# Patient Record
Sex: Male | Born: 1960 | Race: White | Hispanic: No | Marital: Married | Smoking: Never smoker
Health system: Southern US, Community
[De-identification: ages and names within clinical notes are randomized; demographics above are authoritative.]

## PROBLEM LIST (undated history)

## (undated) DIAGNOSIS — E7800 Pure hypercholesterolemia, unspecified: Secondary | ICD-10-CM

## (undated) DIAGNOSIS — K219 Gastro-esophageal reflux disease without esophagitis: Secondary | ICD-10-CM

## (undated) DIAGNOSIS — I10 Essential (primary) hypertension: Secondary | ICD-10-CM

## (undated) DIAGNOSIS — M542 Cervicalgia: Secondary | ICD-10-CM

## (undated) DIAGNOSIS — M722 Plantar fascial fibromatosis: Secondary | ICD-10-CM

## (undated) DIAGNOSIS — N200 Calculus of kidney: Secondary | ICD-10-CM

## (undated) DIAGNOSIS — E785 Hyperlipidemia, unspecified: Secondary | ICD-10-CM

## (undated) HISTORY — DX: Essential (primary) hypertension: I10

## (undated) HISTORY — DX: Pure hypercholesterolemia, unspecified: E78.00

## (undated) HISTORY — PX: HERNIA REPAIR: SHX51

## (undated) HISTORY — DX: Gastro-esophageal reflux disease without esophagitis: K21.9

---

## 2013-05-01 ENCOUNTER — Ambulatory Visit: Payer: Self-pay | Admitting: Gastroenterology

## 2013-05-04 LAB — PATHOLOGY REPORT

## 2013-10-25 ENCOUNTER — Emergency Department: Payer: Self-pay | Admitting: Emergency Medicine

## 2013-10-25 LAB — COMPREHENSIVE METABOLIC PANEL
Alkaline Phosphatase: 104 U/L (ref 50–136)
BUN: 20 mg/dL — ABNORMAL HIGH (ref 7–18)
Bilirubin,Total: 0.5 mg/dL (ref 0.2–1.0)
Calcium, Total: 8.9 mg/dL (ref 8.5–10.1)
Co2: 30 mmol/L (ref 21–32)
EGFR (African American): >60
EGFR (Non-African Amer.): >60
Glucose: 119 mg/dL — ABNORMAL HIGH (ref 65–99)
Potassium: 4 mmol/L (ref 3.5–5.1)
SGOT(AST): 35 U/L (ref 15–37)
Total Protein: 7.6 g/dL (ref 6.4–8.2)

## 2013-10-25 LAB — URINALYSIS, COMPLETE
Glucose,UR: NEGATIVE mg/dL (ref 0–75)
Ketone: NEGATIVE
Nitrite: NEGATIVE
Ph: 6 (ref 4.5–8.0)
Protein: NEGATIVE
RBC,UR: 26 /HPF (ref 0–5)
Specific Gravity: 1.017 (ref 1.003–1.030)

## 2013-10-25 LAB — CBC
HCT: 47.2 % (ref 40.0–52.0)
MCHC: 36.2 g/dL — ABNORMAL HIGH (ref 32.0–36.0)
MCV: 95 fL (ref 80–100)
RDW: 12.9 % (ref 11.5–14.5)
WBC: 10.9 10*3/uL — ABNORMAL HIGH (ref 3.8–10.6)

## 2013-10-25 LAB — LIPASE, BLOOD: Lipase: 125 U/L (ref 73–393)

## 2013-10-29 ENCOUNTER — Ambulatory Visit: Payer: Self-pay | Admitting: Urology

## 2013-11-24 ENCOUNTER — Ambulatory Visit: Payer: Self-pay | Admitting: Urology

## 2014-07-15 ENCOUNTER — Ambulatory Visit: Payer: Self-pay | Admitting: Urology

## 2014-12-18 DIAGNOSIS — I10 Essential (primary) hypertension: Secondary | ICD-10-CM | POA: Insufficient documentation

## 2014-12-18 DIAGNOSIS — E7800 Pure hypercholesterolemia, unspecified: Secondary | ICD-10-CM

## 2014-12-18 DIAGNOSIS — K219 Gastro-esophageal reflux disease without esophagitis: Secondary | ICD-10-CM | POA: Insufficient documentation

## 2014-12-18 HISTORY — DX: Pure hypercholesterolemia, unspecified: E78.00

## 2014-12-18 HISTORY — DX: Essential (primary) hypertension: I10

## 2014-12-18 HISTORY — DX: Gastro-esophageal reflux disease without esophagitis: K21.9

## 2014-12-26 IMAGING — CT CT ABD-PELV W/ CM
1 of 3 series · 13 of 32 positions shown, 19 images · non-contrast
Comparison: none

REASON FOR EXAM: (1) R flank pain; (2) R flank pain
COMMENTS:   May transport without cardiac monitor

PROCEDURE:     CT  - CT ABDOMEN / PELVIS  W  - October 26, 2013 [DATE]
RESULT:     History: Right flank pain.
Comparison Study: No prior.

[Series 2: soft tissue · axial · 0.87mm/px · z∈[-514,-67]mm · 13 of 173 slices shown, 19 images]
[im 12/173  soft-tissue]
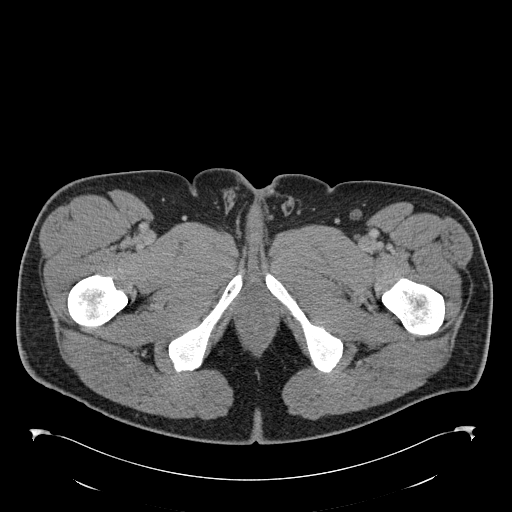
[im 12/173  bone]
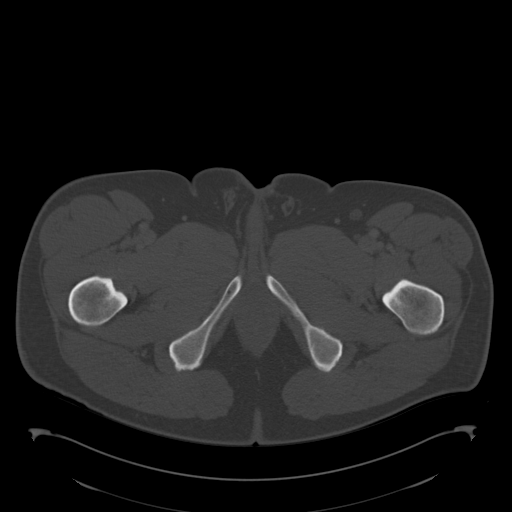
[im 23/173  soft-tissue]
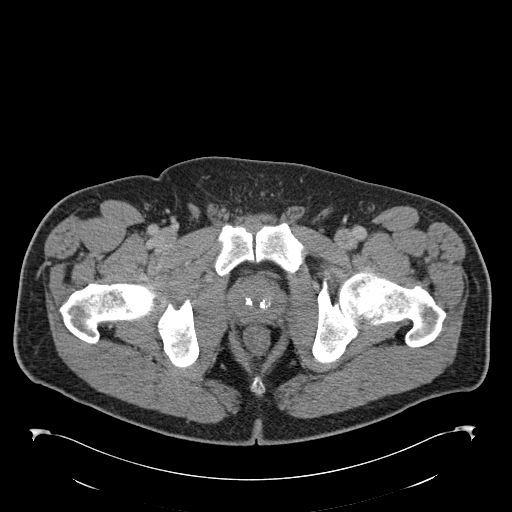
[im 35/173  soft-tissue]
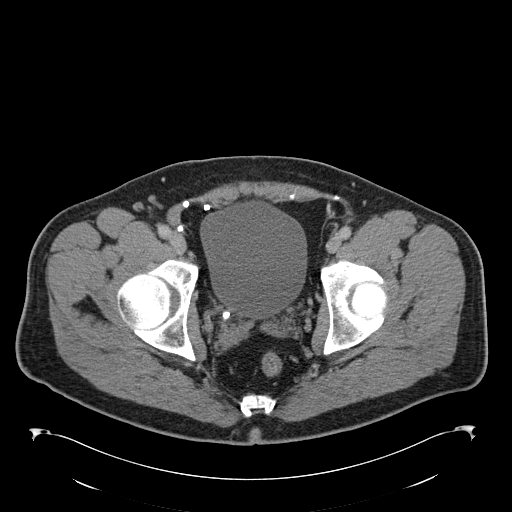
[im 46/173  soft-tissue]
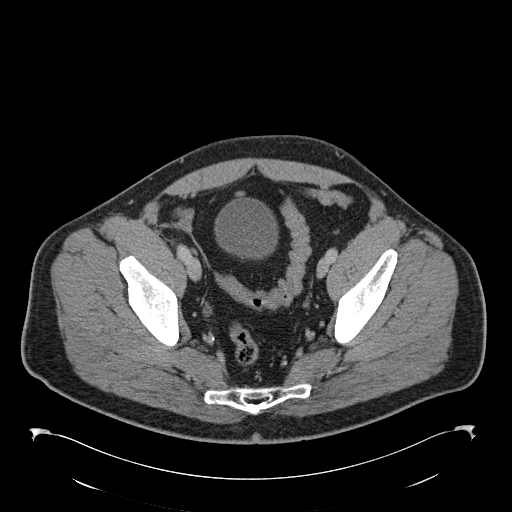
[im 58/173  soft-tissue]
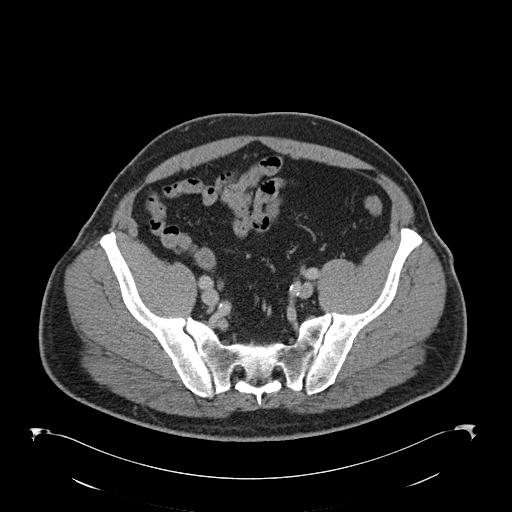
[im 69/173  soft-tissue]
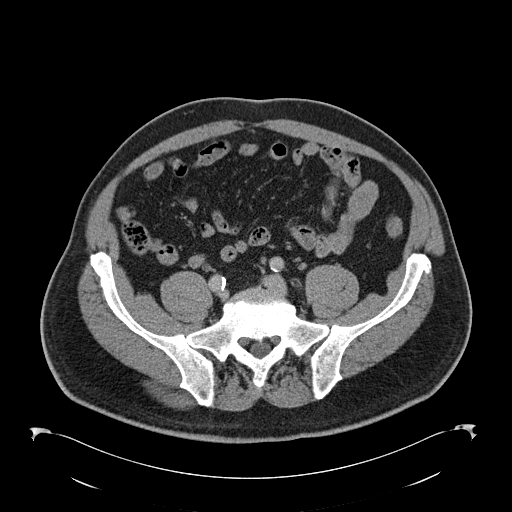
[im 92/173  soft-tissue]
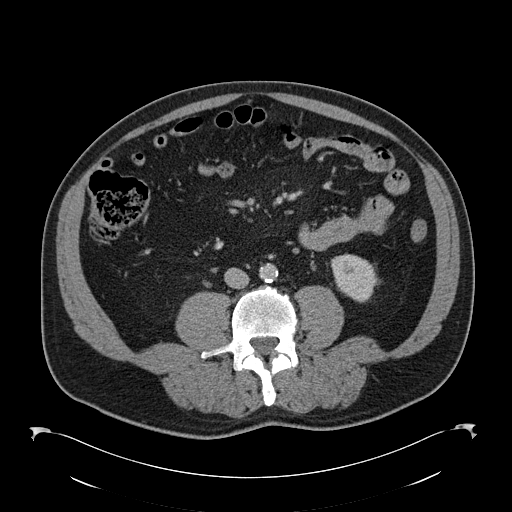
[im 104/173  soft-tissue]
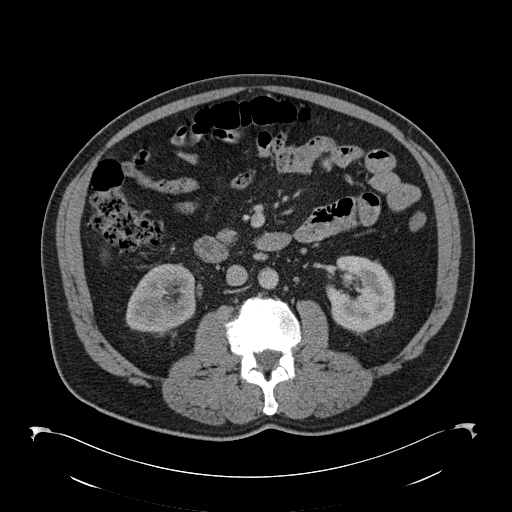
[im 115/173  soft-tissue]
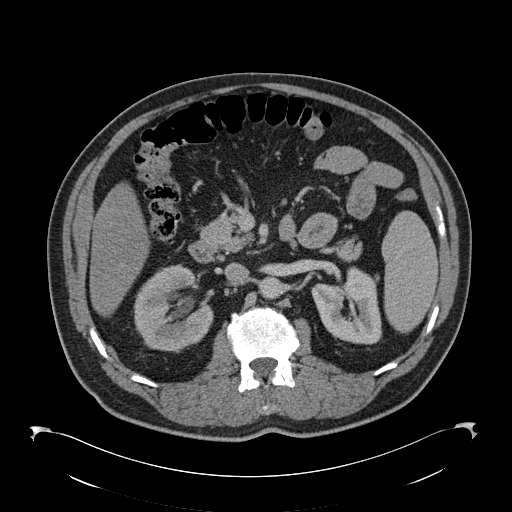
[im 115/173  bone]
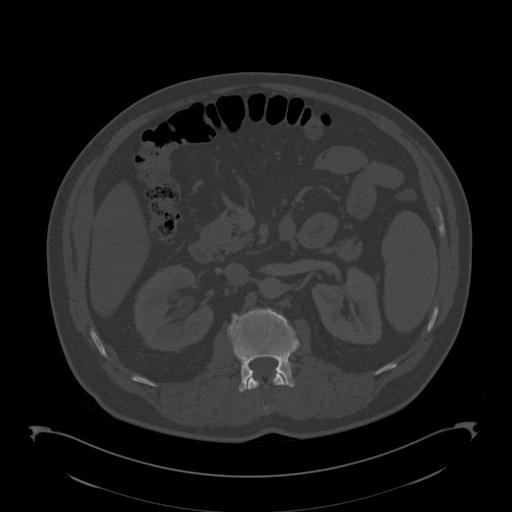
[im 127/173  soft-tissue]
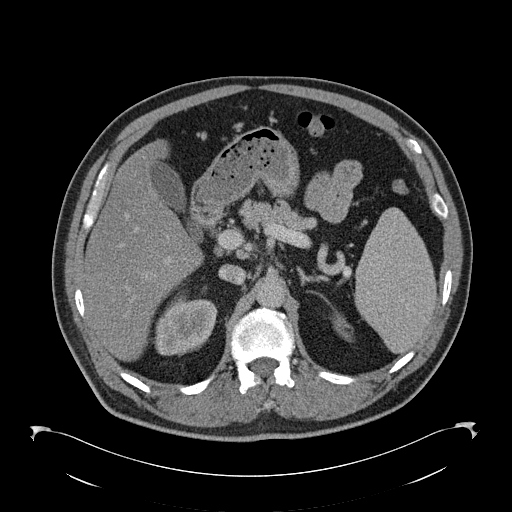
[im 127/173  lung]
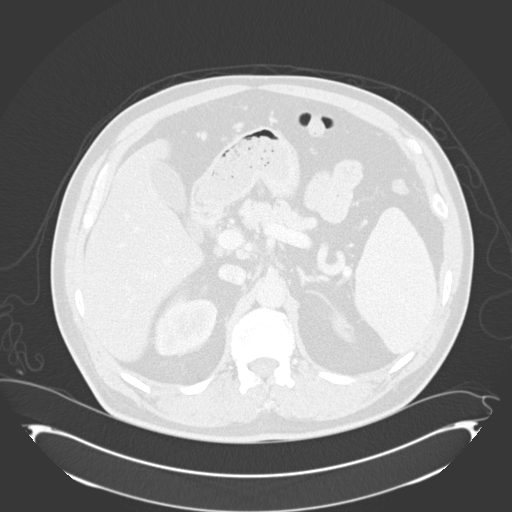
[im 138/173  soft-tissue]
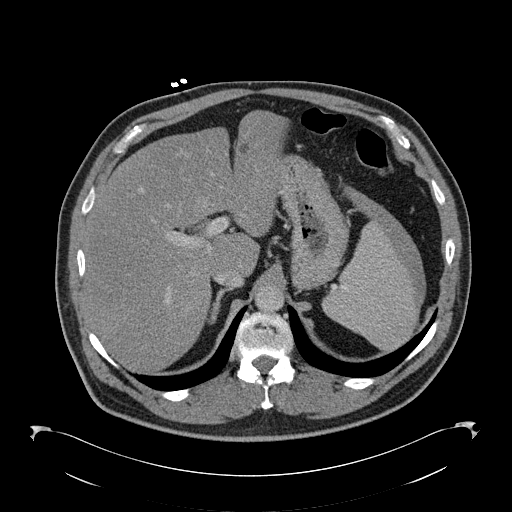
[im 138/173  lung]
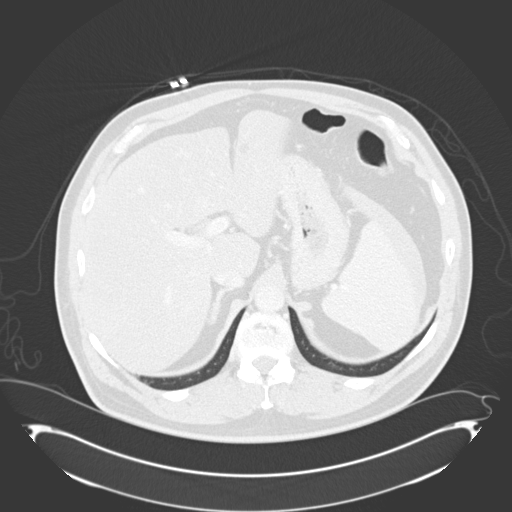
[im 150/173  soft-tissue]
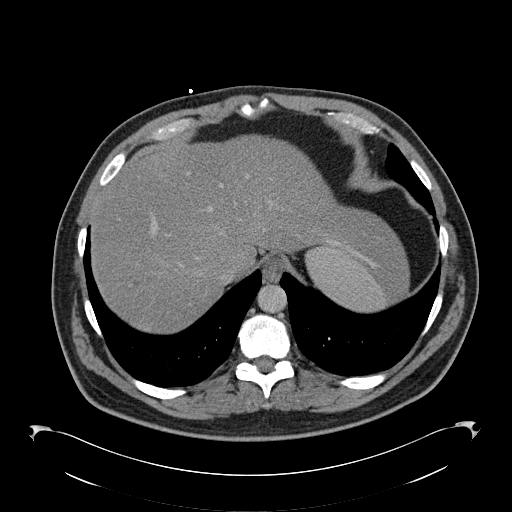
[im 150/173  lung]
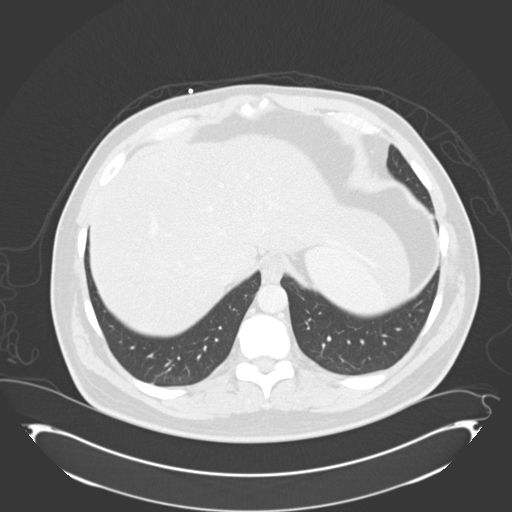
[im 161/173  soft-tissue]
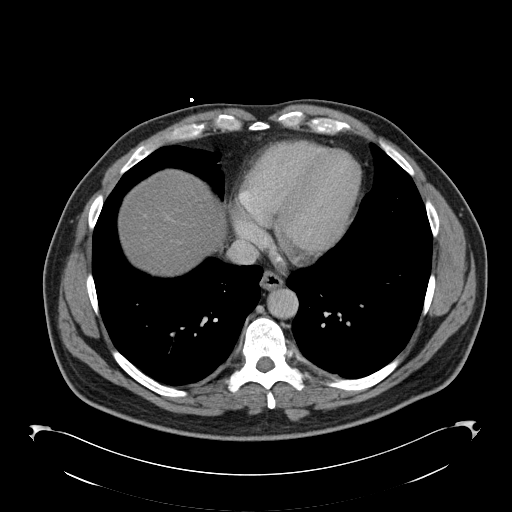
[im 161/173  lung]
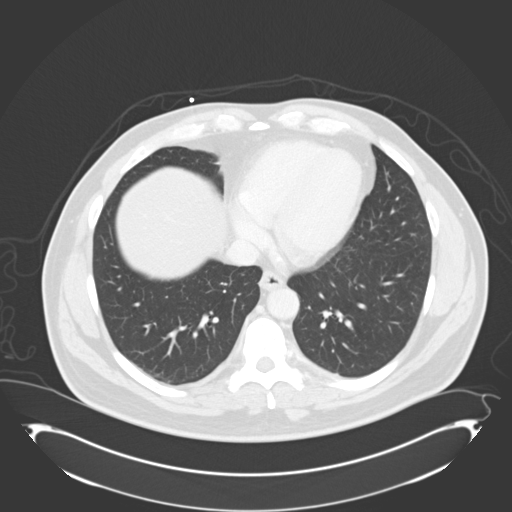

[13 of 32 positions shown; findings below may reference images not displayed]

FINDINGS: Standard nonenhanced scan obtained. No prior studies available for
comparison. Diffuse fatty infiltration liver is present. Tiny enhancing
nodular density in the right hepatic lobe is noted most likely tiny
hemangioma. Simple cyst left hepatic lobe. Spleen normal. Gallbladder
nondistended. No biliary distention. Pancreas normal. Adrenals normal. No
focal hepatic renal abnormality identified. 8mm stone in the distal right
ureter with mild hydronephrosis. Bladder is unremarkable. Calcification in
the prostate. No free pelvic fluid.

No adenopathy. Aorta and its branches are patent.

Lung bases clear. Heart size normal. Degenerative changes lumbar spine.
Postsurgical changes noted consistent prior hernia repair lower abdomen.
IMPRESSION: 8mm stone distal right ureter with mild hydronephrosis.

## 2015-11-04 ENCOUNTER — Encounter: Payer: Self-pay | Admitting: *Deleted

## 2015-11-07 ENCOUNTER — Ambulatory Visit: Payer: BLUE CROSS/BLUE SHIELD | Admitting: Anesthesiology

## 2015-11-07 ENCOUNTER — Ambulatory Visit
Admission: RE | Admit: 2015-11-07 | Discharge: 2015-11-07 | Disposition: A | Discharge status: Home / Self Care | Payer: BLUE CROSS/BLUE SHIELD | Source: Ambulatory Visit | Attending: Gastroenterology | Admitting: Gastroenterology

## 2015-11-07 ENCOUNTER — Encounter
Admission: RE | Disposition: A | Discharge status: Home / Self Care | Payer: Self-pay | Source: Ambulatory Visit | Attending: Gastroenterology

## 2015-11-07 ENCOUNTER — Encounter: Payer: Self-pay | Admitting: Anesthesiology

## 2015-11-07 DIAGNOSIS — R1084 Generalized abdominal pain: Secondary | ICD-10-CM | POA: Insufficient documentation

## 2015-11-07 DIAGNOSIS — Z888 Allergy status to other drugs, medicaments and biological substances status: Secondary | ICD-10-CM | POA: Diagnosis not present

## 2015-11-07 DIAGNOSIS — K210 Gastro-esophageal reflux disease with esophagitis: Secondary | ICD-10-CM | POA: Insufficient documentation

## 2015-11-07 DIAGNOSIS — E785 Hyperlipidemia, unspecified: Secondary | ICD-10-CM | POA: Diagnosis not present

## 2015-11-07 DIAGNOSIS — Z791 Long term (current) use of non-steroidal anti-inflammatories (NSAID): Secondary | ICD-10-CM | POA: Insufficient documentation

## 2015-11-07 DIAGNOSIS — R1013 Epigastric pain: Secondary | ICD-10-CM | POA: Insufficient documentation

## 2015-11-07 DIAGNOSIS — Z79899 Other long term (current) drug therapy: Secondary | ICD-10-CM | POA: Insufficient documentation

## 2015-11-07 DIAGNOSIS — I10 Essential (primary) hypertension: Secondary | ICD-10-CM | POA: Insufficient documentation

## 2015-11-07 DIAGNOSIS — R197 Diarrhea, unspecified: Secondary | ICD-10-CM | POA: Diagnosis not present

## 2015-11-07 HISTORY — DX: Plantar fascial fibromatosis: M72.2

## 2015-11-07 HISTORY — DX: Essential (primary) hypertension: I10

## 2015-11-07 HISTORY — PX: ESOPHAGOGASTRODUODENOSCOPY (EGD) WITH PROPOFOL: SHX5813

## 2015-11-07 HISTORY — DX: Gastro-esophageal reflux disease without esophagitis: K21.9

## 2015-11-07 HISTORY — DX: Calculus of kidney: N20.0

## 2015-11-07 HISTORY — DX: Cervicalgia: M54.2

## 2015-11-07 HISTORY — DX: Hyperlipidemia, unspecified: E78.5

## 2015-11-07 SURGERY — ESOPHAGOGASTRODUODENOSCOPY (EGD) WITH PROPOFOL
Anesthesia: General

## 2015-11-07 MED ORDER — SODIUM CHLORIDE 0.9 % IV SOLN: INTRAVENOUS | Status: DC

## 2015-11-07 MED ORDER — SODIUM CHLORIDE 0.9 % IV SOLN
INTRAVENOUS | Status: DC
  Administered 2015-11-07: 1000 mL via INTRAVENOUS
  Administered 2015-11-07: 14:00:00 via INTRAVENOUS

## 2015-11-07 MED ORDER — MIDAZOLAM HCL 2 MG/2ML IJ SOLN
INTRAMUSCULAR | Status: DC | PRN
  Administered 2015-11-07: 2 mg via INTRAVENOUS

## 2015-11-07 NOTE — Op Note (Signed)
[facility] Patient Name: Dale Sanchez Procedure Date: 11/07/2015 1:53 PM MRN: [id number] Account #: [id number] Date of Birth: 1961/03/03 Admit Type: Outpatient Age: 54 Room: [facility] ENDO ROOM 4 Gender: Male Note Status: Finalized Procedure:         Upper GI endoscopy Indications:       Epigastric abdominal pain, Suspected esophageal reflux Providers:         Ezzard Standing. Bluford Kaufmann, MD Medicines:         Monitored Anesthesia Care Complications:     No immediate complications. Procedure:         Pre-Anesthesia Assessment:                    - Prior to the procedure, a History and Physical was                     performed, and patient medications, allergies and                     sensitivities were reviewed. The patient's tolerance of                     previous anesthesia was reviewed.                    - The risks and benefits of the procedure and the sedation                     options and risks were discussed with the patient. All                     questions were answered and informed consent was obtained.                    - After reviewing the risks and benefits, the patient was                     deemed in satisfactory condition to undergo the procedure.                    After obtaining informed consent, the endoscope was passed                     under direct vision. Throughout the procedure, the                     patient's blood pressure, pulse, and oxygen saturations                     were monitored continuously. The Olympus GIF-160 endoscope                     (S#. [id number]) was introduced through the mouth, and                     advanced to the second part of duodenum. The upper GI                     endoscopy was accomplished without difficulty. The patient                     tolerated the procedure well. Findings:      LA Grade B (one or more mucosal breaks greater than 5 mm, not extending  between the tops of two mucosal  folds) esophagitis was found at the       gastroesophageal junction. Biopsies were taken with a cold forceps for       histology.      The exam was otherwise without abnormality.      The entire examined stomach was normal.      The examined duodenum was normal. Impression:        - LA Grade B reflux esophagitis. Biopsied.                    - The examination was otherwise normal.                    - Normal stomach.                    - Normal examined duodenum. Recommendation:    - Discharge patient to home.                    - Observe patient's clinical course.                    - Continue present medications.                    - The findings and recommendations were discussed with the                     patient. Procedure Code(s): --- Professional ---                    [id number], Esophagogastroduodenoscopy, flexible, transoral;                     with biopsy, single or multiple Diagnosis Code(s): --- Professional ---                    K21.0, Gastro-esophageal reflux disease with esophagitis                    R10.13, Epigastric pain CPT copyright 2014 American Medical Association. All rights reserved. The codes documented in this report are preliminary and upon coder review may  be revised to meet current compliance requirements. [name], MD 11/07/2015 2:11:10 PM This report has been signed electronically. Number of Addenda: 0 Note Initiated On: 11/07/2015 1:53 PM      [facility]

## 2015-11-07 NOTE — Anesthesia Preprocedure Evaluation (Signed)
Anesthesia Evaluation  Patient identified by MRN, date of birth, ID band Patient awake    Reviewed: Allergy & Precautions, H&P , NPO status , Patient's Chart, lab work & pertinent test results, reviewed documented beta blocker date and time   History of Anesthesia Complications Negative for: history of anesthetic complications  Airway Mallampati: III  TM Distance: >3 FB Neck ROM: full    Dental no notable dental hx. (+) Caps   Pulmonary neg pulmonary ROS,    Pulmonary exam normal breath sounds clear to auscultation       Cardiovascular Exercise Tolerance: Good hypertension, On Medications (-) angina(-) CAD, (-) Past MI, (-) Cardiac Stents and (-) CABG Normal cardiovascular exam(-) dysrhythmias (-) Valvular Problems/Murmurs Rhythm:regular Rate:Normal     Neuro/Psych negative neurological ROS  negative psych ROS   GI/Hepatic Neg liver ROS, GERD  Medicated and Poorly Controlled,  Endo/Other  negative endocrine ROS  Renal/GU Renal disease (kidney stone)  negative genitourinary   Musculoskeletal   Abdominal   Peds  Hematology negative hematology ROS (+)   Anesthesia Other Findings Past Medical History:   Hypertension                                                 GERD (gastroesophageal reflux disease)                       Cervicalgia                                                  Hyperlipidemia                                               Nephrolithiasis                                              Plantar fibromatosis                                         Reproductive/Obstetrics negative OB ROS                             Anesthesia Physical Anesthesia Plan  ASA: II  Anesthesia Plan: General   Post-op Pain Management:    Induction:   Airway Management Planned:   Additional Equipment:   Intra-op Plan:   Post-operative Plan:   Informed Consent: I have reviewed the  patients History and Physical, chart, labs and discussed the procedure including the risks, benefits and alternatives for the proposed anesthesia with the patient or authorized representative who has indicated his/her understanding and acceptance.   Dental Advisory Given  Plan Discussed with: Anesthesiologist, CRNA and Surgeon  Anesthesia Plan Comments:         Anesthesia Quick Evaluation

## 2015-11-07 NOTE — Transfer of Care (Signed)
Immediate Anesthesia Transfer of Care Note  Patient: Dale Sanchez  Procedure(s) Performed: Procedure(s): ESOPHAGOGASTRODUODENOSCOPY (EGD) WITH PROPOFOL (N/A)  Patient Location: PACU  Anesthesia Type:General  Level of Consciousness: sedated  Airway & Oxygen Therapy: Patient Spontanous Breathing and Patient connected to nasal cannula oxygen  Post-op Assessment: Report given to RN and Post -op Vital signs reviewed and stable  Post vital signs: Reviewed and stable  Last Vitals:  Filed Vitals:   11/07/15 1309  BP: 152/96  Pulse: 87  Temp: 37.5 C  Resp: 16    Complications: No apparent anesthesia complications

## 2015-11-08 ENCOUNTER — Encounter: Payer: Self-pay | Admitting: Gastroenterology

## 2015-11-08 NOTE — Anesthesia Postprocedure Evaluation (Signed)
  Anesthesia Post-op Note  Patient: Dale Sanchez  Procedure(s) Performed: Procedure(s): ESOPHAGOGASTRODUODENOSCOPY (EGD) WITH PROPOFOL (N/A)  Anesthesia type:General  Patient location: PACU  Post pain: Pain level controlled  Post assessment: Post-op Vital signs reviewed, Patient's Cardiovascular Status Stable, Respiratory Function Stable, Patent Airway and No signs of Nausea or vomiting  Post vital signs: Reviewed and stable  Last Vitals:  Filed Vitals:   11/07/15 1450  BP: 138/94  Pulse: 79  Temp:   Resp: 20    Level of consciousness: awake, alert  and patient cooperative  Complications: No apparent anesthesia complications

## 2015-11-10 ENCOUNTER — Encounter: Payer: Self-pay | Admitting: *Deleted

## 2015-11-10 ENCOUNTER — Other Ambulatory Visit: Payer: Self-pay | Admitting: *Deleted

## 2015-11-10 LAB — SURGICAL PATHOLOGY

## 2015-11-11 ENCOUNTER — Ambulatory Visit (INDEPENDENT_AMBULATORY_CARE_PROVIDER_SITE_OTHER): Payer: BLUE CROSS/BLUE SHIELD | Admitting: Obstetrics and Gynecology

## 2015-11-11 ENCOUNTER — Encounter: Payer: Self-pay | Admitting: Obstetrics and Gynecology

## 2015-11-11 VITALS — BP 141/90 | HR 74 | Temp 98.1°F | Resp 16 | Ht 75.0 in | Wt 245.8 lb

## 2015-11-11 DIAGNOSIS — R109 Unspecified abdominal pain: Secondary | ICD-10-CM | POA: Diagnosis not present

## 2015-11-11 LAB — URINALYSIS, COMPLETE
Bilirubin, UA: NEGATIVE
Glucose, UA: NEGATIVE
Ketones, UA: NEGATIVE
Nitrite, UA: NEGATIVE
PH UA: 5 (ref 5.0–7.5)
PROTEIN UA: NEGATIVE
RBC, UA: NEGATIVE
Specific Gravity, UA: 1.02 (ref 1.005–1.030)
Urobilinogen, Ur: 0.2 mg/dL (ref 0.2–1.0)

## 2015-11-11 LAB — MICROSCOPIC EXAMINATION
BACTERIA UA: NONE SEEN
Epithelial Cells (non renal): NONE SEEN /hpf (ref 0–10)
RBC, UA: NONE SEEN /hpf (ref 0–?)

## 2015-11-11 MED ORDER — TAMSULOSIN HCL 0.4 MG PO CAPS: 0.4000 mg | ORAL_CAPSULE | Freq: Every day | ORAL | Status: DC

## 2015-11-11 NOTE — Progress Notes (Signed)
11/11/2015 10:41 AM   Dale Sanchez May 30, 1961 [id number]  Referring provider: Lauro Regulus, MD [address] [facility] [place], [place] [address]  Chief Complaint  Patient presents with  . Flank Pain    Left side.  ~2 months.  . Establish Care    HPI: Patient is a 54yo male with a history of renal stones presenting today with complaints of left flank pain x 2 months.   Does report urinary frequency.  No dysuria or gross hematuria.  No fevers, nausea or vomiting.  First and only stone episode succesful treated with ESWL 07/15/14.  Prostate cancer screening performed by PCP per patient.  PMH: Past Medical History  Diagnosis Date  . Hypertension   . GERD (gastroesophageal reflux disease)   . Cervicalgia   . Hyperlipidemia   . Nephrolithiasis   . Plantar fibromatosis   . Essential (primary) hypertension 12/18/2014    Last Assessment & Plan:  Blood pressure has been controlled without significant dizzyness or associated fatigue. Taking antihypertensives as directed without difficulty.    . Acid reflux 12/18/2014    Overview:  Off omeprazole.   Last Assessment & Plan:  Heartburn and reflux symptoms are essentially controlled.    . Pure hypercholesterolemia 12/18/2014    Last Assessment & Plan:  Has been working on a low fat diet and no myalgia's are present.      Surgical History: Past Surgical History  Procedure Laterality Date  . Hernia repair    . Esophagogastroduodenoscopy (egd) with propofol N/A 11/07/2015    Procedure: ESOPHAGOGASTRODUODENOSCOPY (EGD) WITH PROPOFOL;  Surgeon: Wallace Cullens, MD;  [facility] END[facility]COPY;  Service: Gastroenterology;  Laterality: N/A;    Home Medications:    Medication List       This list is accurate as of: 11/11/15 10:41 AM.  Always use your most recent med list.               amLODipine 5 MG tablet  Commonly known as:  NORVASC  Take 5 mg by mouth daily.     meloxicam 15 MG tablet  Commonly known  as:  MOBIC  Take 15 mg by mouth daily.     metoprolol 200 MG 24 hr tablet  Commonly known as:  TOPROL-XL  Take 200 mg by mouth daily.     metroNIDAZOLE 500 MG tablet  Commonly known as:  FLAGYL  Take 500 mg by mouth 2 (two) times daily.     omeprazole 20 MG capsule  Commonly known as:  PRILOSEC  Take 20 mg by mouth daily as needed.     pantoprazole 40 MG tablet  Commonly known as:  PROTONIX  Take 40 mg by mouth 2 (two) times daily.     PRAVACHOL 80 MG tablet  Generic drug:  pravastatin  Take 80 mg by mouth daily.     tamsulosin 0.4 MG Caps capsule  Commonly known as:  FLOMAX  Take 1 capsule (0.4 mg total) by mouth daily.     triamcinolone cream 0.1 %  Commonly known as:  KENALOG  APPLY TO AFFECTED AREA(S) 2 - 3 TIMES DAILY FOR 2-3 WEEKS        Allergies:  Allergies  Allergen Reactions  . Cozaar [Losartan]   . Lisinopril     Family History: Family History  Problem Relation Age of Onset  . Heart attack Father   . Hypertension Mother     Social History:  reports that he has  never smoked. He does not have any smokeless tobacco history on file. He reports that he does not drink alcohol. His drug history is not on file.  ROS: UROLOGY Frequent Urination?: Yes Hard to postpone urination?: Yes Burning/pain with urination?: No Get up at night to urinate?: Yes Leakage of urine?: No Urine stream starts and stops?: No Trouble starting stream?: No Do you have to strain to urinate?: No Blood in urine?: No Urinary tract infection?: No Sexually transmitted disease?: No Injury to kidneys or bladder?: No Painful intercourse?: No Weak stream?: No Erection problems?: No Penile pain?: No  Gastrointestinal Nausea?: No Vomiting?: No Indigestion/heartburn?: No Diarrhea?: No Constipation?: No  Constitutional Fever: No Night sweats?: No Weight loss?: No Fatigue?: No  Skin Skin rash/lesions?: No Itching?: No  Eyes Blurred vision?: No Double vision?:  No  Ears/Nose/Throat Sore throat?: No Sinus problems?: No  Hematologic/Lymphatic Swollen glands?: No Easy bruising?: No  Cardiovascular Leg swelling?: No Chest pain?: No  Respiratory Cough?: No Shortness of breath?: No  Endocrine Excessive thirst?: No  Musculoskeletal Back pain?: Yes Joint pain?: Yes  Neurological Headaches?: No Dizziness?: No  Psychologic Depression?: No Anxiety?: No  Physical Exam: BP 141/90 mmHg  Pulse 74  Temp(Src) 98.1 F (36.7 C)  Resp 16  Ht 6\' 3"  (1.905 m)  Wt 245 lb 12.8 oz (111.494 kg)  BMI 30.72 kg/m2  Constitutional:  Alert and oriented, No acute distress. HEENT: [place] AT, moist mucus membranes.  Trachea midline, no masses. Cardiovascular: No clubbing, cyanosis, or edema. Respiratory: Normal respiratory effort, no increased work of breathing. GI: Abdomen is soft, nontender, nondistended, no abdominal masses GU: mild left CVA tenderness.  Skin: No rashes, bruises or suspicious lesions. Lymph: No cervical or inguinal adenopathy. Neurologic: Grossly intact, no focal deficits, moving all 4 extremities. Psychiatric: Normal mood and affect.  Laboratory Data:   Urinalysis    Component Value Date/Time   COLORURINE Yellow 10/25/2013 2104   APPEARANCEUR Clear 10/25/2013 2104   LABSPEC 1.017 10/25/2013 2104   PHURINE 6.0 10/25/2013 2104   GLUCOSEU Negative 10/25/2013 2104   HGBUR 2+ 10/25/2013 2104   BILIRUBINUR Negative 10/25/2013 2104   KETONESUR Negative 10/25/2013 2104   PROTEINUR Negative 10/25/2013 2104   NITRITE Negative 10/25/2013 2104   LEUKOCYTESUR Negative 10/25/2013 2104    Pertinent Imaging:   Assessment & Plan:   1. Flank pain- Patient will complaints of persistent left flank pain for the last 2 months. He denies gross hematuria or dysuria. He is experiencing some increased urinary frequency. He does have a history of renal stones. Last and only episode was approximately 2 years ago and was successfully treated  with ESWL. He reports present symptoms are very similar to his previous stone symptoms. We will obtain a CT renal stone study and have patient follow up for results. He understands to seek immediate medical attention for fevers uncontrolled pain or nausea and vomiting. -Start Flomax -CT Renal Stone Study -BMP - Urinalysis, Complete -Urine strainer provided   Return for CT results .  These notes generated with voice recognition software. I apologize for typographical errors.  Earlie LouLindsay Ezri Fanguy, FNP  [facility] [address], [address] [place], [place] [address] [phone]

## 2015-11-21 ENCOUNTER — Ambulatory Visit
Admission: RE | Admit: 2015-11-21 | Discharge: 2015-11-21 | Disposition: A | Discharge status: Home / Self Care | Payer: BLUE CROSS/BLUE SHIELD | Source: Ambulatory Visit | Attending: Obstetrics and Gynecology | Admitting: Obstetrics and Gynecology

## 2015-11-21 DIAGNOSIS — R109 Unspecified abdominal pain: Secondary | ICD-10-CM

## 2015-11-21 DIAGNOSIS — K760 Fatty (change of) liver, not elsewhere classified: Secondary | ICD-10-CM | POA: Insufficient documentation

## 2015-11-21 DIAGNOSIS — N200 Calculus of kidney: Secondary | ICD-10-CM | POA: Insufficient documentation

## 2015-11-22 ENCOUNTER — Encounter: Payer: Self-pay | Admitting: Obstetrics and Gynecology

## 2015-11-22 ENCOUNTER — Ambulatory Visit (INDEPENDENT_AMBULATORY_CARE_PROVIDER_SITE_OTHER): Payer: BLUE CROSS/BLUE SHIELD | Admitting: Obstetrics and Gynecology

## 2015-11-22 VITALS — BP 133/87 | HR 76 | Resp 16 | Ht 75.0 in | Wt 244.9 lb

## 2015-11-22 DIAGNOSIS — N200 Calculus of kidney: Secondary | ICD-10-CM | POA: Diagnosis not present

## 2015-11-22 DIAGNOSIS — R109 Unspecified abdominal pain: Secondary | ICD-10-CM

## 2015-11-22 LAB — BLADDER SCAN AMB NON-IMAGING: Scan Result: 124

## 2015-11-22 NOTE — Progress Notes (Signed)
11/22/2015 11:53 AM   Dale Sanchez [birth year] [id number]  Referring provider: Lauro RegulusMarshall W Anderson, MD [address] [facility] [place], [place] [id number]  Chief Complaint  Patient presents with  . Flank Pain  . Results    CT    HPI: Patient is a 54yo male with a history of renal stones presenting today to review CT results performed for evaluation of left flank pain x 2 months.   Does report urinary frequency. No dysuria or gross hematuria. No fevers, nausea or vomiting.  First and only stone episode succesful treated with ESWL 07/15/14.  Prostate cancer screening performed by PCP per patient.  CT  IMPRESSION: 1. No acute findings. No findings to explain left flank pain. 2. Small nonobstructing stones in each kidney. No ureteral stone or obstructive uropathy. 3. Extensive hepatic steatosis. 4. Significant degenerative changes noted throughout the visualized spine.   PMH: Past Medical History  Diagnosis Date  . Hypertension   . GERD (gastroesophageal reflux disease)   . Cervicalgia   . Hyperlipidemia   . Nephrolithiasis   . Plantar fibromatosis   . Essential (primary) hypertension 12/18/2014    Last Assessment & Plan:  Blood pressure has been controlled without significant dizzyness or associated fatigue. Taking antihypertensives as directed without difficulty.    . Acid reflux 12/18/2014    Overview:  Off omeprazole.   Last Assessment & Plan:  Heartburn and reflux symptoms are essentially controlled.    . Pure hypercholesterolemia 12/18/2014    Last Assessment & Plan:  Has been working on a low fat diet and no myalgia's are present.      Surgical History: Past Surgical History  Procedure Laterality Date  . Hernia repair    . Esophagogastroduodenoscopy (egd) with propofol N/A 11/07/2015    Procedure: ESOPHAGOGASTRODUODENOSCOPY (EGD) WITH PROPOFOL;  Surgeon: Wallace CullensPaul Y Oh, MD;  Location[facility] ENDOSCOPY;  Service: Gastroenterology;  Laterality:  N/A;    Home Medications:    Medication List       This list is accurate as of: 11/22/15 11:53 AM.  Always use your most recent med list.               amLODipine 5 MG tablet  Commonly known as:  NORVASC  Take 5 mg by mouth daily.     meloxicam 15 MG tablet  Commonly known as:  MOBIC  Take 15 mg by mouth daily.     metoprolol 200 MG 24 hr tablet  Commonly known as:  TOPROL-XL  Take 200 mg by mouth daily.     omeprazole 20 MG capsule  Commonly known as:  PRILOSEC  Take 20 mg by mouth daily as needed.     pantoprazole 40 MG tablet  Commonly known as:  PROTONIX  Take 40 mg by mouth 2 (two) times daily.     PRAVACHOL 80 MG tablet  Generic drug:  pravastatin  Take 80 mg by mouth daily.     tamsulosin 0.4 MG Caps capsule  Commonly known as:  FLOMAX  Take 1 capsule (0.4 mg total) by mouth daily.     triamcinolone cream 0.1 %  Commonly known as:  KENALOG  APPLY TO AFFECTED AREA(S) 2 - 3 TIMES DAILY FOR 2-3 WEEKS        Allergies:  Allergies  Allergen Reactions  . Cozaar [Losartan]   . Lisinopril     Family History: Family History  Problem Relation Age of Onset  . Heart attack Father   .  Hypertension Mother     Social History:  reports that he has never smoked. He does not have any smokeless tobacco history on file. He reports that he does not drink alcohol. His drug history is not on file.  ROS: UROLOGY Frequent Urination?: Yes Hard to postpone urination?: Yes Burning/pain with urination?: No Get up at night to urinate?: Yes Leakage of urine?: No Urine stream starts and stops?: No Trouble starting stream?: No Do you have to strain to urinate?: No Blood in urine?: No Urinary tract infection?: No Sexually transmitted disease?: No Injury to kidneys or bladder?: No Painful intercourse?: No Weak stream?: No Erection problems?: No Penile pain?: No  Gastrointestinal Nausea?: No Vomiting?: No Indigestion/heartburn?: No Diarrhea?:  No Constipation?: No  Constitutional Fever: No Night sweats?: No Weight loss?: No Fatigue?: No  Skin Skin rash/lesions?: No Itching?: No  Eyes Blurred vision?: No Double vision?: No  Ears/Nose/Throat Sore throat?: No Sinus problems?: No  Hematologic/Lymphatic Swollen glands?: No Easy bruising?: No  Cardiovascular Leg swelling?: No Chest pain?: No  Respiratory Cough?: No Shortness of breath?: No  Endocrine Excessive thirst?: No  Musculoskeletal Back pain?: No Joint pain?: No  Neurological Headaches?: No Dizziness?: No  Psychologic Depression?: No Anxiety?: No  Physical Exam: BP 133/87 mmHg  Pulse 76  Resp 16  Ht  (1.905 m)  Wt 244 lb 14.4 oz (111.086 kg)  BMI 30.61 kg/m2  Constitutional:  Alert and oriented, No acute distress. HEENT: [place] AT, moist mucus membranes.  Trachea midline, no masses. Cardiovascular: No clubbing, cyanosis, or edema. Respiratory: Normal respiratory effort, no increased work of breathing. Skin: No rashes, bruises or suspicious lesions. Neurologic: Grossly intact, no focal deficits, moving all 4 extremities. Psychiatric: Normal mood and affect.  Laboratory Data:   Urinalysis    Component Value Date/Time   COLORURINE Yellow 10/25/2013 2104   APPEARANCEUR Clear 10/25/2013 2104   LABSPEC 1.017 10/25/2013 2104   PHURINE 6.0 10/25/2013 2104   GLUCOSEU Negative 11/11/2015 0912   GLUCOSEU Negative 10/25/2013 2104   HGBUR 2+ 10/25/2013 2104   BILIRUBINUR Negative 11/11/2015 0912   BILIRUBINUR Negative 10/25/2013 2104   KETONESUR Negative 10/25/2013 2104   PROTEINUR Negative 10/25/2013 2104   NITRITE Negative 11/11/2015 0912   NITRITE Negative 10/25/2013 2104   LEUKOCYTESUR Trace* 11/11/2015 0912   LEUKOCYTESUR Negative 10/25/2013 2104    Pertinent Imaging: CLINICAL DATA: Left flank pain for 2 months. History of a hernia repair.  EXAM: CT ABDOMEN AND PELVIS WITHOUT CONTRAST  TECHNIQUE: Multidetector CT  imaging of the abdomen and pelvis was performed following the standard protocol without IV contrast.  COMPARISON: CT, 10/26/2013.  FINDINGS: Lung bases: Clear. Heart normal size.  Liver: Diffuse fatty infiltration. No mass or focal lesion.  Spleen, gallbladder, pancreas, adrenal glands: Normal.  Kidneys, ureters, bladder: Small bilateral nonobstructing intrarenal stones. No renal masses. No hydronephrosis. Normal ureters. Bladder is unremarkable.  Lymph nodes: No adenopathy.  Ascites: None.  Gastrointestinal: Stomach, small bowel and colon are unremarkable. No appendix visualized.  Abdominal wall: Surgical mesh noted along the inguinal regions bilaterally consistent with previous bilateral inguinal herniorrhaphies, stable from the prior CT. No recurrent or new hernia.  Musculoskeletal: Degenerative changes noted throughout the visualized spine. No osteoblastic or osteolytic lesions.  IMPRESSION: 1. No acute findings. No findings to explain left flank pain. 2. Small nonobstructing stones in each kidney. No ureteral stone or obstructive uropathy. 3. Extensive hepatic steatosis. 4. Significant degenerative changes noted throughout the visualized spine.   Electronically Signed  By:  Amie Portland M.D.  On: 11/21/2015 15:02          Assessment & Plan:    1. Flank pain- Unclear etiology.  There is no clear urologic cause of his pain.  Possibly related to DDD. - Basic metabolic panel to assess renal function.  2. Urinary Frequency- Slightly elevated PVR . I recommended double voiding in the mornings and prior to bed. Patient states he only took previously prescribed Flomax for a few days but is unsure if there was an improvement in urinary symptoms.  I suggested patient try Flomax again and monitor symptoms.  He has his annual physical scheduled with his PCP in January and will f/u with him for refills if desired. We discussed general  stone prevention techniques including drinking plenty water with goal of producing 2.5 L urine daily, increased citric acid intake, avoidance of high oxalate containing foods, and decreased salt intake.  Information about dietary recommendations given today.   3. Kidney stones- Small bilateral renal calculi. No obstruction. F/u 1 year with [facility] prior.  No Follow-up on file.  These notes generated with voice recognition software. I apologize for typographical errors.  Earlie Lou, FNP  [facility] [address], [address] [place], [place] [address] [phone]

## 2015-11-23 LAB — BASIC METABOLIC PANEL
BUN / CREAT RATIO: 12 (ref 9–20)
BUN: 13 mg/dL (ref 6–24)
CHLORIDE: 103 mmol/L (ref 97–106)
CO2: 24 mmol/L (ref 18–29)
Calcium: 9.6 mg/dL (ref 8.7–10.2)
Creatinine, Ser: 1.06 mg/dL (ref 0.76–1.27)
GFR calc non Af Amer: 80 mL/min/{1.73_m2} (ref >59)
GFR, EST AFRICAN AMERICAN: 92 mL/min/{1.73_m2} (ref >59)
Glucose: 91 mg/dL (ref 65–99)
Potassium: 4.2 mmol/L (ref 3.5–5.2)
SODIUM: 142 mmol/L (ref 136–144)

## 2015-11-28 ENCOUNTER — Telehealth: Payer: Self-pay

## 2015-11-28 NOTE — Telephone Encounter (Signed)
-----   Message from Fernanda DrumLindsay C Overton, FNP sent at 11/23/2015  9:19 AM EST ----- Please notify patient that his kidney function levels were good. thanks

## 2015-11-28 NOTE — Telephone Encounter (Signed)
LMOM- labs good  

## 2015-12-07 ENCOUNTER — Other Ambulatory Visit: Payer: Self-pay | Admitting: Obstetrics and Gynecology

## 2015-12-07 DIAGNOSIS — N200 Calculus of kidney: Secondary | ICD-10-CM

## 2016-07-17 ENCOUNTER — Other Ambulatory Visit
Admission: RE | Admit: 2016-07-17 | Discharge: 2016-07-17 | Disposition: A | Discharge status: Home / Self Care | Payer: BLUE CROSS/BLUE SHIELD | Source: Ambulatory Visit | Attending: Internal Medicine | Admitting: Internal Medicine

## 2016-07-17 DIAGNOSIS — R079 Chest pain, unspecified: Secondary | ICD-10-CM | POA: Diagnosis not present

## 2016-07-17 LAB — FIBRIN DERIVATIVES D-DIMER (ARMC ONLY): Fibrin derivatives D-dimer (ARMC): 310 (ref 0–499)

## 2016-07-17 LAB — TROPONIN I

## 2016-11-21 ENCOUNTER — Ambulatory Visit: Payer: BLUE CROSS/BLUE SHIELD | Admitting: Urology

## 2016-12-10 ENCOUNTER — Ambulatory Visit (INDEPENDENT_AMBULATORY_CARE_PROVIDER_SITE_OTHER): Payer: BLUE CROSS/BLUE SHIELD | Admitting: Urology

## 2016-12-10 ENCOUNTER — Other Ambulatory Visit: Payer: Self-pay | Admitting: *Deleted

## 2016-12-10 ENCOUNTER — Encounter: Payer: Self-pay | Admitting: Urology

## 2016-12-10 ENCOUNTER — Ambulatory Visit
Admission: RE | Admit: 2016-12-10 | Discharge: 2016-12-10 | Disposition: A | Discharge status: Home / Self Care | Payer: BLUE CROSS/BLUE SHIELD | Source: Ambulatory Visit | Attending: Urology | Admitting: Urology

## 2016-12-10 VITALS — BP 175/81 | HR 71 | Ht 75.0 in | Wt 244.3 lb

## 2016-12-10 DIAGNOSIS — N200 Calculus of kidney: Secondary | ICD-10-CM

## 2016-12-10 NOTE — Progress Notes (Signed)
12/10/2016 4:33 PM   Dale Sanchez 1961/11/10 [id number]  Referring provider: Lauro RegulusMarshall W Anderson, MD [address] [facility] [place], [place] [address]  Chief Complaint  Patient presents with  . Follow-up    1 year follow up kidney stone    HPI: Patient is a 55 yo Caucasian male with a history of renal stones presenting today for a yearly follow up.    Patient has not had any flank pain, passage of fragments or gross hematuria.  He is not had any urinary symptoms. The urinary frequency that he was experiencing last year has abated.  First and only stone episode successful treated with ESWL 07/15/14.  Prostate cancer screening performed by PCP per patient.  KUB taken today did not note any evidence of renal calculi or ureteral calculi. I have independently reviewed the films.    PMH: Past Medical History:  Diagnosis Date  . Acid reflux 12/18/2014   Overview:  Off omeprazole.   Last Assessment & Plan:  Heartburn and reflux symptoms are essentially controlled.    . Cervicalgia   . Essential (primary) hypertension 12/18/2014   Last Assessment & Plan:  Blood pressure has been controlled without significant dizzyness or associated fatigue. Taking antihypertensives as directed without difficulty.    Marland Kitchen. GERD (gastroesophageal reflux disease)   . Hyperlipidemia   . Hypertension   . Nephrolithiasis   . Plantar fibromatosis   . Pure hypercholesterolemia 12/18/2014   Last Assessment & Plan:  Has been working on a low fat diet and no myalgia's are present.      Surgical History: Past Surgical History:  Procedure Laterality Date  . ESOPHAGOGASTRODUODENOSCOPY (EGD) WITH PROPOFOL N/A 11/07/2015   Procedure: ESOPHAGOGASTRODUODENOSCOPY (EGD) WITH PROPOFOL;  Surgeon: Wallace CullensPaul Y Oh, MD;  [facility];  Service: Gastroenterology;  Laterality: N/A;  . HERNIA REPAIR      Home Medications:    Medication List       Accurate as of 12/10/16  4:33 PM. Always  use your most recent med list.          amLODipine 5 MG tablet Commonly known as:  NORVASC Take 5 mg by mouth daily.   atorvastatin 80 MG tablet Commonly known as:  LIPITOR Take by mouth.   azelastine 0.1 % nasal spray Commonly known as:  ASTELIN Place into the nose.   meloxicam 15 MG tablet Commonly known as:  MOBIC Take 15 mg by mouth daily.   metoprolol 200 MG 24 hr tablet Commonly known as:  TOPROL-XL Take 200 mg by mouth daily.   omeprazole 20 MG capsule Commonly known as:  PRILOSEC Take 20 mg by mouth daily as needed.   pantoprazole 40 MG tablet Commonly known as:  PROTONIX Take 40 mg by mouth 2 (two) times daily.   PRAVACHOL 80 MG tablet Generic drug:  pravastatin Take 80 mg by mouth daily.   tamsulosin 0.4 MG Caps capsule Commonly known as:  FLOMAX TAKE 1 CAPSULE (0.4 MG TOTAL) BY MOUTH DAILY.   triamcinolone cream 0.1 % Commonly known as:  KENALOG APPLY TO AFFECTED AREA(S) 2 - 3 TIMES DAILY FOR 2-3 WEEKS       Allergies:  Allergies  Allergen Reactions  . Cozaar [Losartan]   . Lisinopril     Family History: Family History  Problem Relation Age of Onset  . Heart attack Father   . Hypertension Mother   . Prostate cancer Neg Hx   . Kidney disease Neg Hx   .  Bladder Cancer Neg Hx     Social History:  reports that he has never smoked. He has never used smokeless tobacco. He reports that he does not drink alcohol or use drugs.  ROS: UROLOGY Frequent Urination?: No Hard to postpone urination?: No Burning/pain with urination?: No Get up at night to urinate?: No Leakage of urine?: No Urine stream starts and stops?: No Trouble starting stream?: No Do you have to strain to urinate?: No Blood in urine?: No Urinary tract infection?: No Sexually transmitted disease?: No Injury to kidneys or bladder?: No Painful intercourse?: No Weak stream?: No Erection problems?: No Penile pain?: No  Gastrointestinal Nausea?: No Vomiting?:  No Indigestion/heartburn?: No Diarrhea?: No Constipation?: No  Constitutional Fever: No Night sweats?: No Weight loss?: No Fatigue?: No  Skin Skin rash/lesions?: No Itching?: No  Eyes Blurred vision?: No Double vision?: No  Ears/Nose/Throat Sore throat?: No Sinus problems?: No  Hematologic/Lymphatic Swollen glands?: No Easy bruising?: No  Cardiovascular Leg swelling?: No Chest pain?: No  Respiratory Cough?: No Shortness of breath?: No  Endocrine Excessive thirst?: No  Musculoskeletal Back pain?: Yes Joint pain?: Yes  Neurological Headaches?: No Dizziness?: No  Psychologic Depression?: No Anxiety?: No  Physical Exam: BP (!) 175/81   Pulse 71   Ht 6\' 3"  (1.905 m)   Wt 244 lb 4.8 oz (110.8 kg)   BMI 30.54 kg/m   Constitutional:  Alert and oriented, No acute distress. HEENT: [place] AT, moist mucus membranes.  Trachea midline, no masses. Cardiovascular: No clubbing, cyanosis, or edema. Respiratory: Normal respiratory effort, no increased work of breathing. Skin: No rashes, bruises or suspicious lesions. Neurologic: Grossly intact, no focal deficits, moving all 4 extremities. Psychiatric: Normal mood and affect.    Pertinent Imaging: CLINICAL DATA:  Nephrolithiasis.  EXAM: ABDOMEN - 1 VIEW  COMPARISON:  Radiographs of July 15, 2014.  FINDINGS: The bowel gas pattern is normal. No radio-opaque calculi or other significant radiographic abnormality are seen.  IMPRESSION: No evidence of bowel obstruction or ileus. No nephrolithiasis is noted.   Electronically Signed   By: Lupita RaiderJames  Green Jr, M.D.   On: 12/10/2016 16:38  Assessment & Plan:     1. Kidney stones- Small bilateral renal calculi seen on CT.  None seen on today's KUB.   No obstruction. F/u 1 year with KUB prior.  - Advised to contact our office or seek treatment in the ED if becomes febrile or pain/ vomiting are difficult control in order to arrange for emergent/urgent  intervention  Return in about 1 year (around 12/10/2017) for KUB .  These notes generated with voice recognition software. I apologize for typographical errors.  Dale CowboySHANNON Adaleigh Warf, PA-C  [facility] [address], [address] [place], [place] [address] [phone]

## 2017-08-05 ENCOUNTER — Other Ambulatory Visit: Payer: Self-pay | Admitting: Student

## 2017-08-05 DIAGNOSIS — M5412 Radiculopathy, cervical region: Secondary | ICD-10-CM

## 2017-09-16 ENCOUNTER — Ambulatory Visit
Admission: RE | Admit: 2017-09-16 | Discharge: 2017-09-16 | Disposition: A | Discharge status: Home / Self Care | Payer: BLUE CROSS/BLUE SHIELD | Source: Ambulatory Visit | Attending: Student | Admitting: Student

## 2017-09-16 DIAGNOSIS — M5023 Other cervical disc displacement, cervicothoracic region: Secondary | ICD-10-CM | POA: Insufficient documentation

## 2017-09-16 DIAGNOSIS — M5412 Radiculopathy, cervical region: Secondary | ICD-10-CM | POA: Insufficient documentation

## 2017-09-16 DIAGNOSIS — M4802 Spinal stenosis, cervical region: Secondary | ICD-10-CM | POA: Insufficient documentation

## 2017-12-10 ENCOUNTER — Ambulatory Visit: Payer: BLUE CROSS/BLUE SHIELD | Admitting: Urology

## 2020-10-25 ENCOUNTER — Other Ambulatory Visit: Payer: Self-pay | Admitting: Orthopedic Surgery

## 2020-10-25 DIAGNOSIS — M5412 Radiculopathy, cervical region: Secondary | ICD-10-CM

## 2020-11-03 ENCOUNTER — Other Ambulatory Visit: Payer: Self-pay

## 2020-11-03 ENCOUNTER — Ambulatory Visit
Admission: RE | Admit: 2020-11-03 | Discharge: 2020-11-03 | Disposition: A | Discharge status: Home / Self Care | Payer: BC Managed Care – PPO | Source: Ambulatory Visit | Attending: Orthopedic Surgery | Admitting: Orthopedic Surgery

## 2020-11-03 DIAGNOSIS — M5412 Radiculopathy, cervical region: Secondary | ICD-10-CM | POA: Insufficient documentation

## 2021-03-21 ENCOUNTER — Emergency Department
Admission: EM | Admit: 2021-03-21 | Discharge: 2021-03-21 | Disposition: A | Discharge status: Home / Self Care | Payer: BC Managed Care – PPO | Attending: Emergency Medicine | Admitting: Emergency Medicine

## 2021-03-21 ENCOUNTER — Emergency Department: Payer: BC Managed Care – PPO

## 2021-03-21 ENCOUNTER — Other Ambulatory Visit: Payer: Self-pay

## 2021-03-21 DIAGNOSIS — Z79899 Other long term (current) drug therapy: Secondary | ICD-10-CM | POA: Diagnosis not present

## 2021-03-21 DIAGNOSIS — R079 Chest pain, unspecified: Secondary | ICD-10-CM | POA: Insufficient documentation

## 2021-03-21 DIAGNOSIS — I10 Essential (primary) hypertension: Secondary | ICD-10-CM | POA: Diagnosis not present

## 2021-03-21 DIAGNOSIS — K219 Gastro-esophageal reflux disease without esophagitis: Secondary | ICD-10-CM | POA: Insufficient documentation

## 2021-03-21 LAB — BASIC METABOLIC PANEL
Anion gap: 8 (ref 5–15)
BUN: 25 mg/dL — ABNORMAL HIGH (ref 6–20)
CO2: 26 mmol/L (ref 22–32)
Calcium: 9.4 mg/dL (ref 8.9–10.3)
Chloride: 106 mmol/L (ref 98–111)
Creatinine, Ser: 1.14 mg/dL (ref 0.61–1.24)
GFR, Estimated: >60 mL/min (ref >60)
Glucose, Bld: 119 mg/dL — ABNORMAL HIGH (ref 70–99)
Potassium: 3.8 mmol/L (ref 3.5–5.1)
Sodium: 140 mmol/L (ref 135–145)

## 2021-03-21 LAB — CBC
HCT: 47.5 % (ref 39.0–52.0)
Hemoglobin: 16.9 g/dL (ref 13.0–17.0)
MCH: 33.3 pg (ref 26.0–34.0)
MCHC: 35.6 g/dL (ref 30.0–36.0)
MCV: 93.5 fL (ref 80.0–100.0)
Platelets: 143 10*3/uL — ABNORMAL LOW (ref 150–400)
RBC: 5.08 MIL/uL (ref 4.22–5.81)
RDW: 12.4 % (ref 11.5–15.5)
WBC: 6.9 10*3/uL (ref 4.0–10.5)
nRBC: 0 % (ref 0.0–0.2)

## 2021-03-21 LAB — D-DIMER, QUANTITATIVE: D-Dimer, Quant: 2.9 ug/mL-FEU — ABNORMAL HIGH (ref 0.00–0.50)

## 2021-03-21 LAB — TROPONIN I (HIGH SENSITIVITY): Troponin I (High Sensitivity): 8 ng/L (ref <18)

## 2021-03-21 MED ORDER — IOHEXOL 350 MG/ML SOLN
100.0000 mL | Freq: Once | INTRAVENOUS | Status: AC | PRN
  Administered 2021-03-21: 100 mL via INTRAVENOUS

## 2021-03-21 NOTE — ED Provider Notes (Signed)
[facility] Emergency Department Provider Note  Time seen: 10:40 AM  I have reviewed the triage vital signs and the nursing notes.   HISTORY  Chief Complaint Chest Pain   HPI Dale Sanchez is a 60 y.o. male with a past medical history of gastric reflux, hypertension, hyperlipidemia, presents to the emergency department for chest pain.  According to the patient since Saturday he has been experiencing left-sided chest pain, worse with deep inspiration.  Denies any nausea shortness of breath/cough or diaphoresis.  No leg pain or swelling.  No hormonal supplements no history of DVTs.  No history of MI or cardiac disease.  Describes his chest pain as minimal but moderate sharp pain if he takes a deep breath.   Past Medical History:  Diagnosis Date  . Acid reflux 12/18/2014   Overview:  Off omeprazole.   Last Assessment & Plan:  Heartburn and reflux symptoms are essentially controlled.    . Cervicalgia   . Essential (primary) hypertension 12/18/2014   Last Assessment & Plan:  Blood pressure has been controlled without significant dizzyness or associated fatigue. Taking antihypertensives as directed without difficulty.    Marland Kitchen GERD (gastroesophageal reflux disease)   . Hyperlipidemia   . Hypertension   . Nephrolithiasis   . Plantar fibromatosis   . Pure hypercholesterolemia 12/18/2014   Last Assessment & Plan:  Has been working on a low fat diet and no myalgia's are present.      Patient Active Problem List   Diagnosis Date Noted  . Essential (primary) hypertension 12/18/2014  . Acid reflux 12/18/2014  . Pure hypercholesterolemia 12/18/2014    Past Surgical History:  Procedure Laterality Date  . ESOPHAGOGASTRODUODENOSCOPY (EGD) WITH PROPOFOL N/A 11/07/2015   Procedure: ESOPHAGOGASTRODUODENOSCOPY (EGD) WITH PROPOFOL;  Surgeon: Wallace Cullens, MD;  Location: [facility] ENDOSCOPY;  Service: Gastroenterology;  Laterality: N/A;  . HERNIA REPAIR      Prior to Admission  medications   Medication Sig Start Date End Date Taking? Authorizing Provider  amLODipine (NORVASC) 5 MG tablet Take 5 mg by mouth daily.    [provider]  atorvastatin (LIPITOR) 80 MG tablet Take by mouth. 01/04/16 01/03/17  [provider]  azelastine (ASTELIN) 0.1 % nasal spray Place into the nose. 01/04/16 01/03/17  [provider]  meloxicam (MOBIC) 15 MG tablet Take 15 mg by mouth daily.    [provider]  metoprolol (TOPROL-XL) 200 MG 24 hr tablet Take 200 mg by mouth daily.    [provider]  omeprazole (PRILOSEC) 20 MG capsule Take 20 mg by mouth daily as needed.    [provider]  pantoprazole (PROTONIX) 40 MG tablet Take 40 mg by mouth 2 (two) times daily.     [provider]  pravastatin (PRAVACHOL) 80 MG tablet Take 80 mg by mouth daily.    [provider]  tamsulosin (FLOMAX) 0.4 MG CAPS capsule TAKE 1 CAPSULE (0.4 MG TOTAL) BY MOUTH DAILY. Patient not taking: Reported on 12/10/2016 12/07/15   Fernanda Drum, FNP  triamcinolone cream (KENALOG) 0.1 % APPLY TO AFFECTED AREA(S) 2 - 3 TIMES DAILY FOR 2-3 WEEKS 08/03/15   [provider]    Allergies  Allergen Reactions  . Cozaar [Losartan]   . Lisinopril     Family History  Problem Relation Age of Onset  . Heart attack Father   . Hypertension Mother   . Prostate cancer Neg Hx   . Kidney disease Neg Hx   .  Bladder Cancer Neg Hx     Social History Social History   Tobacco Use  . Smoking status: Never Smoker  . Smokeless tobacco: Never Used  Substance Use Topics  . Alcohol use: No    Alcohol/week: 0.0 standard drinks  . Drug use: No    Review of Systems Constitutional: Negative for fever. Cardiovascular: Left chest pain worse with deep inspiration Respiratory: Negative for shortness of breath. Gastrointestinal: Negative for abdominal pain, vomiting  Musculoskeletal: Negative for musculoskeletal complaints Neurological: Negative for  headache All other ROS negative  ____________________________________________   PHYSICAL EXAM:  VITAL SIGNS: ED Triage Vitals  Enc Vitals Group     BP 03/21/21 0914 (!) 149/105     Pulse Rate 03/21/21 0914 88     Resp 03/21/21 0914 18     Temp 03/21/21 0914 98.2 F (36.8 C)     Temp Source 03/21/21 0914 Oral     SpO2 03/21/21 0914 97 %     Weight 03/21/21 0915 240 lb (108.9 kg)     Height 03/21/21 0915 6\' 3"  (1.905 m)     Head Circumference --      Peak Flow --      Pain Score 03/21/21 0915 3     Pain Loc --      Pain Edu? --      Excl. in [place]? --    Constitutional: Alert and oriented. Well appearing and in no distress. Eyes: Normal exam ENT      Head: Normocephalic and atraumatic.      Mouth/Throat: Mucous membranes are moist. Cardiovascular: Normal rate, regular rhythm.  Respiratory: Normal respiratory effort without tachypnea nor retractions. Breath sounds are clear  Gastrointestinal: Soft and nontender. No distention.   Musculoskeletal: Nontender with normal range of motion in all extremities.  Neurologic:  Normal speech and language. No gross focal neurologic deficits  Skin:  Skin is warm, dry and intact.  Psychiatric: Mood and affect are normal.   ____________________________________________    EKG  EKG viewed and interpreted by myself shows a normal sinus rhythm 88 bpm with a narrow QRS, normal axis, normal intervals, no concerning ST changes.  ____________________________________________    RADIOLOGY  Chest x-ray is negative CTA negative for large pulmonary embolus ____________________________________________   INITIAL IMPRESSION / ASSESSMENT AND PLAN / ED COURSE  Pertinent labs & imaging results that were available during my care of the patient were reviewed by me and considered in my medical decision making (see chart for details).   Patient presents to the emergency department for left-sided chest pain, worse with deep inspiration.  Pain is been  ongoing for the past 3 days.  States minimal discomfort when at rest but moderate sharp pain with deep inspiration.  Nontender chest on examination.  Reassuring vitals besides mild hypertension.  Patient is currently taking hypertensive medications.  Patient's labs are resulted showing a negative troponin however D-dimer is elevated at 2.9.  Given the patient's left-sided pleuritic chest pain with elevated D-dimer we will proceed with a CTA of the chest to rule out pulmonary embolism.  Discussed this with the patient who is agreeable to plan of care.  Due to the timing of the contrast CTA shows no central or hilar pulmonary artery embolus but unable to rule out segmental or distal embolus.  Patient's heart rate is normal vitals are largely normal.  Given the CTA being largely negative I believe a reasonable compromise instead of repeating a CT scan would be an ultrasound of  the lower extremities.  If the ultrasound is negative for DVT I believe the patient could be safely discharged home with PCP follow-up.  Patient agreeable to plan of care.  Patient care signed out to oncoming provider.  WILIAN KWONG Sherron Sanchez was evaluated in Emergency Department on 03/21/2021 for the symptoms described in the history of present illness. He was evaluated in the context of the global COVID-19 pandemic, which necessitated consideration that the patient might be at risk for infection with the SARS-CoV-2 virus that causes COVID-19. Institutional protocols and algorithms that pertain to the evaluation of patients at risk for COVID-19 are in a state of rapid change based on information released by regulatory bodies including the CDC and federal and state organizations. These policies and algorithms were followed during the patient's care in the ED.  ____________________________________________   FINAL CLINICAL IMPRESSION(S) / ED DIAGNOSES  Left chest pain   Minna Antis, MD 03/21/21 1510

## 2021-03-21 NOTE — ED Notes (Signed)
Patient transported to CT at this time. 

## 2021-03-21 NOTE — ED Triage Notes (Signed)
Pt state L sided CP, with neck/shoulder pain that began Saturday. Hx HTN but no cardiac hx. A&O, ambulatory. States pain with deep breath.

## 2021-03-21 NOTE — Discharge Instructions (Signed)
No signs of blood clots in your legs or lungs.  No signs of heart strain or damage.  Please continue all of your typical medications and follow-up with your PCP.  Return to the ED with any worsening chest pain

## 2021-03-21 NOTE — ED Notes (Signed)
Patient's wife called for update. Patient is aware and will contact the wife. Patient independently went to the room commode. Patient denies pain. NAD.

## 2021-03-30 ENCOUNTER — Other Ambulatory Visit: Payer: Self-pay | Admitting: Internal Medicine

## 2021-03-30 ENCOUNTER — Ambulatory Visit
Admission: RE | Admit: 2021-03-30 | Discharge: 2021-03-30 | Disposition: A | Discharge status: Home / Self Care | Payer: BC Managed Care – PPO | Source: Ambulatory Visit | Attending: Internal Medicine | Admitting: Internal Medicine

## 2021-03-30 ENCOUNTER — Other Ambulatory Visit: Payer: Self-pay

## 2021-03-30 DIAGNOSIS — R071 Chest pain on breathing: Secondary | ICD-10-CM | POA: Diagnosis present

## 2021-03-30 MED ORDER — IOHEXOL 350 MG/ML SOLN
100.0000 mL | Freq: Once | INTRAVENOUS | Status: AC | PRN
  Administered 2021-03-30: 100 mL via INTRAVENOUS

## 2023-03-20 ENCOUNTER — Other Ambulatory Visit: Payer: Self-pay

## 2023-03-20 ENCOUNTER — Emergency Department
Admission: EM | Admit: 2023-03-20 | Discharge: 2023-03-20 | Disposition: A | Discharge status: Home / Self Care | Payer: BC Managed Care – PPO | Attending: Emergency Medicine | Admitting: Emergency Medicine

## 2023-03-20 ENCOUNTER — Emergency Department: Payer: BC Managed Care – PPO

## 2023-03-20 ENCOUNTER — Encounter: Payer: Self-pay | Admitting: Emergency Medicine

## 2023-03-20 DIAGNOSIS — I10 Essential (primary) hypertension: Secondary | ICD-10-CM | POA: Diagnosis not present

## 2023-03-20 DIAGNOSIS — R042 Hemoptysis: Secondary | ICD-10-CM | POA: Insufficient documentation

## 2023-03-20 LAB — COMPREHENSIVE METABOLIC PANEL
ALT: 57 U/L — ABNORMAL HIGH (ref 0–44)
AST: 42 U/L — ABNORMAL HIGH (ref 15–41)
Albumin: 4.7 g/dL (ref 3.5–5.0)
Alkaline Phosphatase: 77 U/L (ref 38–126)
Anion gap: 14 (ref 5–15)
BUN: 15 mg/dL (ref 6–20)
CO2: 21 mmol/L — ABNORMAL LOW (ref 22–32)
Calcium: 9.5 mg/dL (ref 8.9–10.3)
Chloride: 105 mmol/L (ref 98–111)
Creatinine, Ser: 1.13 mg/dL (ref 0.61–1.24)
GFR, Estimated: >60 mL/min (ref >60)
Glucose, Bld: 115 mg/dL — ABNORMAL HIGH (ref 70–99)
Potassium: 4 mmol/L (ref 3.5–5.1)
Sodium: 140 mmol/L (ref 135–145)
Total Bilirubin: 1.4 mg/dL — ABNORMAL HIGH (ref 0.3–1.2)
Total Protein: 8 g/dL (ref 6.5–8.1)

## 2023-03-20 LAB — CBC WITH DIFFERENTIAL/PLATELET
Abs Immature Granulocytes: 0.01 10*3/uL (ref 0.00–0.07)
Basophils Absolute: 0.1 10*3/uL (ref 0.0–0.1)
Basophils Relative: 1 %
Eosinophils Absolute: 0.1 10*3/uL (ref 0.0–0.5)
Eosinophils Relative: 2 %
HCT: 52.6 % — ABNORMAL HIGH (ref 39.0–52.0)
Hemoglobin: 18.8 g/dL — ABNORMAL HIGH (ref 13.0–17.0)
Immature Granulocytes: 0 %
Lymphocytes Relative: 18 %
Lymphs Abs: 1 10*3/uL (ref 0.7–4.0)
MCH: 32.5 pg (ref 26.0–34.0)
MCHC: 35.7 g/dL (ref 30.0–36.0)
MCV: 91 fL (ref 80.0–100.0)
Monocytes Absolute: 0.4 10*3/uL (ref 0.1–1.0)
Monocytes Relative: 8 %
Neutro Abs: 3.9 10*3/uL (ref 1.7–7.7)
Neutrophils Relative %: 71 %
Platelets: 151 10*3/uL (ref 150–400)
RBC: 5.78 MIL/uL (ref 4.22–5.81)
RDW: 12.2 % (ref 11.5–15.5)
WBC: 5.4 10*3/uL (ref 4.0–10.5)
nRBC: 0 % (ref 0.0–0.2)

## 2023-03-20 MED ORDER — OMEPRAZOLE MAGNESIUM 20 MG PO TBEC: 20.0000 mg | DELAYED_RELEASE_TABLET | Freq: Every day | ORAL | 1 refills | Status: AC

## 2023-03-20 NOTE — ED Provider Notes (Signed)
[facility] Provider Note    Event Date/Time   First MD Initiated Contact with Patient 03/20/23 1509     (approximate)   History   Hemoptysis   HPI  DAYMEIN GELFAND Wilfred Curtis is a 62 y.o. male   Past medical history of HLD, HTN, GERD presents to the emergency department with hemoptysis.  He was in his regular state of health and was brushing his teeth this morning and gagged on the toothbrush after which he coughed up some blood.  He continued to cough twice more where he saw blood but to a lesser degree.  Bleeding stopped.  He has no recent illnesses, URI symptoms, sore throat and denies chest pain or shortness of breath associated.  He feels at baseline now with no acute medical complaints.  He does not smoke, drink alcohol significantly, use drugs.  He is not on blood thinners.  He does have a history of GERD and he stopped taking his PPI and continues to take meloxicam for back pain.    External Medical Documents Reviewed: Telephone encounter from internal medicine dated earlier today where the patient reported coughing up blood, was told to come to the ER      Physical Exam   Triage Vital Signs: ED Triage Vitals  Enc Vitals Group     BP 03/20/23 1245 (!) 138/104     Pulse Rate 03/20/23 1245 87     Resp 03/20/23 1245 18     Temp 03/20/23 1245 98.6 F (37 C)     Temp Source 03/20/23 1245 Oral     SpO2 03/20/23 1245 96 %     Weight --      Height --      Head Circumference --      Peak Flow --      Pain Score 03/20/23 1244 0     Pain Loc --      Pain Edu? --      Excl. in [place]? --     Most recent vital signs: Vitals:   03/20/23 1245  BP: (!) 138/104  Pulse: 87  Resp: 18  Temp: 98.6 F (37 C)  SpO2: 96%    General: Awake, no distress.  CV:  Good peripheral perfusion.  Resp:  Normal effort.  Abd:  No distention.  Other:  Well-appearing with mild hypertension otherwise vital signs normal.  Posterior oropharynx has no lesions exudates  masses.  Lungs are clear.  Abdomen is soft and nontender deep palpation all quadrants.  Skin appears warm well-perfused his neck is supple with full range of motion.  On auscultation of the lungs there is no crepitus   ED Results / Procedures / Treatments   Labs (all labs ordered are listed, but only abnormal results are displayed) Labs Reviewed  CBC WITH DIFFERENTIAL/PLATELET - Abnormal; Notable for the following components:      Result Value   Hemoglobin 18.8 (*)    HCT 52.6 (*)    All other components within normal limits  COMPREHENSIVE METABOLIC PANEL - Abnormal; Notable for the following components:   CO2 21 (*)    Glucose, Bld 115 (*)    AST 42 (*)    ALT 57 (*)    Total Bilirubin 1.4 (*)    All other components within normal limits     I ordered and reviewed the above labs they are notable for mildly elevated LFTs and an H&H of 18/52  RADIOLOGY I independently reviewed and interpreted  chest x-ray see no obvious focality or pneumothorax, no mediastinum air.   PROCEDURES:  Critical Care performed: No  Procedures   MEDICATIONS ORDERED IN ED: Medications - No data to display  IMPRESSION / MDM / [place] / ED COURSE  I reviewed the triage vital signs and the nursing notes.                                Patient's presentation is most consistent with acute presentation with potential threat to life or bodily function.  Differential diagnosis includes, but is not limited to, hemoptysis due to oropharyngeal irritation, GERD, lung cancer, PE, local trauma loss anemia, upper GI bleed   The patient is on the cardiac monitor to evaluate for evidence of arrhythmia and/or significant heart rate changes.  MDM: This is a patient with single occurrence of hemoptysis now resolved and no other associated symptoms chest pain shortness of breath or recent illnesses.  Benign exam well appearing with normal vital signs and a normal H&H.  No significant risk factors for  cancer though this is a possibility.  I doubt emergent pathologies like ACS, PE, esophageal rupture, emergent blood loss - given benign exam well appearance normal vital signs normal H&H.  I considered hospitalization for admission or observation however given self resolved event with no associated symptoms and negative workup as above I think outpatient follow-up is most appropriate this time.  Advised patient to follow-up with his GI doctor which she already has an appointment in several weeks, and continue his PPI and reduce the amount of NSAIDs if this may be related to gastritis.  I asked him to follow-up with his PMD to discuss cancer screenings as needed.  I also advised him to come back to the emergency department should he develop any new or worsening symptoms        FINAL CLINICAL IMPRESSION(S) / ED DIAGNOSES   Final diagnoses:  Hemoptysis     Rx / DC Orders   ED Discharge Orders          Ordered    omeprazole (PRILOSEC OTC) 20 MG tablet  Daily        03/20/23 1557             Note:  This document was prepared using Dragon voice recognition software and may include unintentional dictation errors.    Lucillie Garfinkel, MD 03/20/23 [phone]

## 2023-03-20 NOTE — Discharge Instructions (Addendum)
Take omeprazole for your GERD, and limit the intake of your meloxicam for arthritis if possible as this may make your chances of having stomach bleeding worse.  Call your primary doctor to schedule a follow-up appointment for more testing is needed, especially if you have ongoing occurrences of coughing up blood.  At the time of your evaluation in the emergency department your testing and exam were normal and not indicative of any emergency events however if you have any new or worsening symptoms including but not limited to severe bleeding, pain, difficulty breathing, high fever, or any other new or worsening symptoms call 911 and come back to the emergency department.  Thank you for choosing [place] for your health care today!  Please see your primary doctor this week for a follow up appointment.   Sometimes, in the early stages of certain disease courses it is difficult to detect in the emergency department evaluation -- so, it is important that you continue to monitor your symptoms and call your doctor right away or return to the emergency department if you develop any new or worsening symptoms.  Please go to the following website to schedule new (and existing) patient appointments:   [email or web address]  If you do not have a primary doctor try calling the following clinics to establish care:  If you have insurance:  [facility] [phone] [place] [address]   [facility]  [phone] [address] [place] [address]   If you do not have insurance:  [facility]  [phone] [address], [place] [address]   The following is another list of primary care offices in the area who are accepting new patients at this time.  Please reach out to one of them directly and let them know you would like to schedule an appointment to follow up on an Emergency Department visit, and/or to  establish a new primary care provider (PCP).  There are likely other primary care clinics in the are who are accepting new patients, but this is an excellent place to start:  [place] physician: Dr Lavon Paganini [address] [place], [place] [address] [phone]  [facility] Lead Physician: Dr Steele Sizer [address], [place], [place] [address] [phone]  [place] Physician: Dr Park Liter [address] [place], [place] [address] [phone]  [facility] Lead Physician: Dr Dewaine [address] [place], [place], [place] [address] [phone]  [facility] Physician: Dr Halina Maidens [address] [place],  [address] [phone]   It was my pleasure to care for you today.   Hoover Brunette Jacelyn Grip, MD

## 2023-03-20 NOTE — ED Triage Notes (Signed)
Patient to ED for coughing up blood. Patient states it started this morning while brushing his teeth. Bright red in color.

## 2023-03-20 NOTE — ED Notes (Signed)
ED provider at bedside.

## 2023-03-20 NOTE — ED Notes (Signed)
X-ray at bedside

## 2023-03-22 ENCOUNTER — Other Ambulatory Visit: Payer: Self-pay | Admitting: Internal Medicine

## 2023-03-22 ENCOUNTER — Ambulatory Visit
Admission: RE | Admit: 2023-03-22 | Discharge: 2023-03-22 | Disposition: A | Discharge status: Home / Self Care | Payer: BC Managed Care – PPO | Source: Ambulatory Visit | Attending: Internal Medicine | Admitting: Internal Medicine

## 2023-03-22 ENCOUNTER — Ambulatory Visit: Payer: BC Managed Care – PPO

## 2023-03-22 DIAGNOSIS — R042 Hemoptysis: Secondary | ICD-10-CM | POA: Diagnosis present

## 2023-03-22 MED ORDER — IOHEXOL 300 MG/ML  SOLN
75.0000 mL | Freq: Once | INTRAMUSCULAR | Status: AC | PRN
  Administered 2023-03-22: 75 mL via INTRAVENOUS

## 2023-06-04 ENCOUNTER — Ambulatory Visit: Payer: BC Managed Care – PPO

## 2023-06-04 DIAGNOSIS — Z1211 Encounter for screening for malignant neoplasm of colon: Secondary | ICD-10-CM | POA: Diagnosis present

## 2023-06-04 DIAGNOSIS — K5730 Diverticulosis of large intestine without perforation or abscess without bleeding: Secondary | ICD-10-CM | POA: Diagnosis not present

## 2023-06-04 DIAGNOSIS — K2289 Other specified disease of esophagus: Secondary | ICD-10-CM | POA: Diagnosis not present

## 2023-06-04 DIAGNOSIS — D123 Benign neoplasm of transverse colon: Secondary | ICD-10-CM | POA: Diagnosis not present

## 2023-06-04 DIAGNOSIS — K2970 Gastritis, unspecified, without bleeding: Secondary | ICD-10-CM | POA: Diagnosis not present

## 2023-06-04 DIAGNOSIS — K640 First degree hemorrhoids: Secondary | ICD-10-CM | POA: Diagnosis not present

## 2023-06-05 ENCOUNTER — Other Ambulatory Visit: Payer: Self-pay | Admitting: Internal Medicine

## 2023-06-05 DIAGNOSIS — R1013 Epigastric pain: Secondary | ICD-10-CM

## 2023-06-11 ENCOUNTER — Ambulatory Visit
Admission: RE | Admit: 2023-06-11 | Discharge: 2023-06-11 | Disposition: A | Discharge status: Home / Self Care | Payer: BC Managed Care – PPO | Source: Ambulatory Visit | Attending: Internal Medicine | Admitting: Internal Medicine

## 2023-06-11 DIAGNOSIS — R1013 Epigastric pain: Secondary | ICD-10-CM | POA: Insufficient documentation

## 2023-10-04 ENCOUNTER — Other Ambulatory Visit: Payer: Self-pay | Admitting: Unknown Physician Specialty

## 2023-10-04 DIAGNOSIS — R430 Anosmia: Secondary | ICD-10-CM

## 2024-03-16 ENCOUNTER — Other Ambulatory Visit: Payer: Self-pay | Admitting: Orthopedic Surgery

## 2024-03-16 DIAGNOSIS — M4807 Spinal stenosis, lumbosacral region: Secondary | ICD-10-CM

## 2024-03-18 ENCOUNTER — Ambulatory Visit
Admission: RE | Admit: 2024-03-18 | Discharge: 2024-03-18 | Disposition: A | Discharge status: Home / Self Care | Source: Ambulatory Visit | Attending: Orthopedic Surgery | Admitting: Orthopedic Surgery

## 2024-03-18 DIAGNOSIS — M4807 Spinal stenosis, lumbosacral region: Secondary | ICD-10-CM | POA: Insufficient documentation

## 2024-04-01 ENCOUNTER — Other Ambulatory Visit: Payer: Self-pay | Admitting: Specialist

## 2024-04-01 DIAGNOSIS — R918 Other nonspecific abnormal finding of lung field: Secondary | ICD-10-CM

## 2024-04-01 DIAGNOSIS — R0602 Shortness of breath: Secondary | ICD-10-CM

## 2024-04-07 ENCOUNTER — Ambulatory Visit
Admission: RE | Admit: 2024-04-07 | Discharge: 2024-04-07 | Disposition: A | Discharge status: Home / Self Care | Source: Ambulatory Visit | Attending: Specialist | Admitting: Specialist

## 2024-04-07 DIAGNOSIS — R0602 Shortness of breath: Secondary | ICD-10-CM | POA: Insufficient documentation

## 2024-04-07 DIAGNOSIS — R918 Other nonspecific abnormal finding of lung field: Secondary | ICD-10-CM | POA: Diagnosis present
